# Patient Record
Sex: Female | Born: 2000
Health system: Southern US, Community
[De-identification: ages and names within clinical notes are randomized; demographics above are authoritative.]

## PROBLEM LIST (undated history)

## (undated) HISTORY — PX: TYMPANOSTOMY TUBE PLACEMENT: SHX32

---
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
## 2021-05-10 ENCOUNTER — Other Ambulatory Visit: Payer: Self-pay

## 2021-05-10 ENCOUNTER — Ambulatory Visit
Admission: RE | Admit: 2021-05-10 | Discharge: 2021-05-10 | Disposition: A | Discharge status: Home / Self Care | Payer: PRIVATE HEALTH INSURANCE | Source: Ambulatory Visit

## 2021-05-10 VITALS — BP 112/79 | HR 122 | Temp 99.5°F | Resp 16 | Wt 170.0 lb

## 2021-05-10 DIAGNOSIS — J069 Acute upper respiratory infection, unspecified: Secondary | ICD-10-CM | POA: Diagnosis not present

## 2021-05-10 MED ORDER — BENZONATATE 100 MG PO CAPS: 100.0000 mg | ORAL_CAPSULE | Freq: Three times a day (TID) | ORAL | 0 refills | Status: AC | PRN

## 2021-05-10 NOTE — Discharge Instructions (Addendum)
Your COVID and Influenza tests are pending.  You should self quarantine until the test results are back.    Take Tylenol or ibuprofen as needed for fever or discomfort.  Rest and keep yourself hydrated.    Follow-up with your primary care provider if your symptoms are not improving.     

## 2021-05-10 NOTE — ED Provider Notes (Signed)
[name]    CSN: [id number] Arrival date & time: 05/10/21  1050      History   Chief Complaint Chief Complaint  Patient presents with  . Cough  . Fever    HPI Monica Brown is a 20 y.o. female.   Patient presents with 3-day history of fever, nasal congestion, nonproductive cough.  T-max 102 last night.  Treatment at home with Tylenol cold medication.  She denies rash, sore throat, shortness of breath, vomiting, diarrhea, or other symptoms.  She denies pertinent medical history.  The history is provided by the patient.    History reviewed. No pertinent past medical history.  Patient Active Problem List   Diagnosis Date Noted  . Ear canal dryness, bilateral 04/27/2020  . Epigastric pain 04/27/2020  . CN (constipation) 12/02/2011    Past Surgical History:  Procedure Laterality Date  . TYMPANOSTOMY TUBE PLACEMENT Bilateral    child- and removed    OB History   No obstetric history on file.      Home Medications    Prior to Admission medications   Medication Sig Start Date End Date Taking? Authorizing Provider  benzonatate (TESSALON) 100 MG capsule Take 1 capsule (100 mg total) by mouth 3 (three) times daily as needed for cough. 05/10/21  Yes Mickie Bail, NP  levonorgestrel [facility]) 19.5 MG IUD by Intrauterine route once.   Yes [provider]  famotidine (PEPCID) 20 MG tablet Take 1 tablet (20 mg total) by mouth at bedtime. 04/26/20   Theadore Nan, NP  levonorgestrel (MIRENA) 20 MCG/24HR IUD 1 each by Intrauterine route once.    [provider]    Family History Family History  Problem Relation Age of Onset  . Healthy Mother   . Healthy Father     Social History Social History   Tobacco Use  . Smoking status: Never Smoker  . Smokeless tobacco: Never Used  Substance Use Topics  . Alcohol use: Never    Alcohol/week: 0.0 standard drinks  . Drug use: Never     Allergies   Patient has no known allergies.   Review  of Systems Review of Systems  Constitutional: Positive for fever. Negative for chills.  HENT: Positive for congestion. Negative for ear pain and sore throat.   Respiratory: Positive for cough. Negative for shortness of breath.   Cardiovascular: Negative for chest pain and palpitations.  Gastrointestinal: Negative for abdominal pain, diarrhea and vomiting.  Skin: Negative for color change and rash.  All other systems reviewed and are negative.    Physical Exam Triage Vital Signs ED Triage Vitals  Enc Vitals Group     BP      Pulse      Resp      Temp      Temp src      SpO2      Weight      Height      Head Circumference      Peak Flow      Pain Score      Pain Loc      Pain Edu?      Excl. in GC?    No data found.  Updated Vital Signs BP 112/79 (BP Location: Left Arm)   Pulse (!) 122   Temp 99.5 F (37.5 C) (Oral)   Resp 16   Wt 170 lb (77.1 kg)   SpO2 98%   BMI 29.18 kg/m   Visual Acuity Right Eye Distance:  Left Eye Distance:   Bilateral Distance:    Right Eye Near:   Left Eye Near:    Bilateral Near:     Physical Exam Vitals and nursing note reviewed.  Constitutional:      General: She is not in acute distress.    Appearance: She is well-developed.  HENT:     Head: Normocephalic and atraumatic.     Right Ear: Tympanic membrane normal.     Left Ear: Tympanic membrane normal.     Nose: Nose normal.     Mouth/Throat:     Mouth: Mucous membranes are moist.     Pharynx: Oropharynx is clear.  Eyes:     Conjunctiva/sclera: Conjunctivae normal.  Cardiovascular:     Rate and Rhythm: Normal rate and regular rhythm.     Heart sounds: Normal heart sounds.  Pulmonary:     Effort: Pulmonary effort is normal. No respiratory distress.     Breath sounds: Normal breath sounds.  Abdominal:     Palpations: Abdomen is soft.     Tenderness: There is no abdominal tenderness.  Musculoskeletal:     Cervical back: Neck supple.  Skin:    General: Skin is warm  and dry.  Neurological:     General: No focal deficit present.     Mental Status: She is alert and oriented to person, place, and time.     Gait: Gait normal.  Psychiatric:        Mood and Affect: Mood normal.        Behavior: Behavior normal.      UC Treatments / Results  Labs (all labs ordered are listed, but only abnormal results are displayed) Labs Reviewed  COVID-19, FLU A+B NAA    EKG   Radiology No results found.  Procedures Procedures (including critical care time)  Medications Ordered in UC Medications - No data to display  Initial Impression / Assessment and Plan / UC Course  I have reviewed the triage vital signs and the nursing notes.  Pertinent labs & imaging results that were available during my care of the patient were reviewed by me and considered in my medical decision making (see chart for details).   Viral URI with cough.  Influenza and COVID pending.  Instructed patient to self quarantine until the test results are back.  Discussed symptomatic treatment including Tessalon for cough, Tylenol or ibuprofen, rest, hydration.  Instructed patient to follow up with PCP if her symptoms are not improving.  Patient agrees to plan of care.    Final Clinical Impressions(s) / UC Diagnoses   Final diagnoses:  Viral URI with cough     Discharge Instructions     Your COVID and Influenza tests are pending.  You should self quarantine until the test results are back.    Take Tylenol or ibuprofen as needed for fever or discomfort.  Rest and keep yourself hydrated.    Follow-up with your primary care provider if your symptoms are not improving.        ED Prescriptions    Medication Sig Dispense Auth. Provider   benzonatate (TESSALON) 100 MG capsule Take 1 capsule (100 mg total) by mouth 3 (three) times daily as needed for cough. 21 capsule Mickie Bail, NP     PDMP not reviewed this encounter.   Mickie Bail, NP 05/10/21 1118

## 2021-05-10 NOTE — ED Triage Notes (Signed)
Patient presents to Urgent Care with complaints of cough, fever (99.7) x 3 days. Pt states she had 2 negative rapid tests. Treating symptoms with tylenol.   Denies abdominal pain, or n/v.

## 2021-05-11 LAB — COVID-19, FLU A+B NAA
Influenza A, NAA: DETECTED — AB
Influenza B, NAA: NOT DETECTED
SARS-CoV-2, NAA: NOT DETECTED

## 2021-06-12 ENCOUNTER — Ambulatory Visit
Admission: EM | Admit: 2021-06-12 | Discharge: 2021-06-12 | Disposition: A | Discharge status: Home / Self Care | Payer: PRIVATE HEALTH INSURANCE | Attending: Emergency Medicine | Admitting: Emergency Medicine

## 2021-06-12 ENCOUNTER — Encounter: Payer: Self-pay | Admitting: Emergency Medicine

## 2021-06-12 ENCOUNTER — Other Ambulatory Visit: Payer: Self-pay

## 2021-06-12 DIAGNOSIS — J029 Acute pharyngitis, unspecified: Secondary | ICD-10-CM | POA: Insufficient documentation

## 2021-06-12 DIAGNOSIS — J358 Other chronic diseases of tonsils and adenoids: Secondary | ICD-10-CM

## 2021-06-12 LAB — POCT RAPID STREP A (OFFICE): Rapid Strep A Screen: NEGATIVE

## 2021-06-12 NOTE — Discharge Instructions (Addendum)
Your rapid strep test is negative.  A throat culture is pending; we will call you if it is positive requiring treatment.    Take Tylenol or ibuprofen as needed for discomfort.  Follow up with your primary care provider if your symptoms are not improving.    

## 2021-06-12 NOTE — ED Provider Notes (Signed)
[name]    CSN: [id number] Arrival date & time: 06/12/21  1412      History   Chief Complaint Chief Complaint  Patient presents with   Sore Throat    HPI Monica Brown is a 20 y.o. female.  Patient presents with sore throat since this morning.  She denies fever, chills, rash, cough, shortness of breath, or other symptoms.  No treatments attempted at home.  Patient states she has history of tonsil stones.  She also states she has recently been treated with MetroGel and is concerned that she may have developed a yeast infection in her mouth from the office.    The history is provided by the patient.   History reviewed. No pertinent past medical history.  Patient Active Problem List   Diagnosis Date Noted   Ear canal dryness, bilateral 04/27/2020   Epigastric pain 04/27/2020   CN (constipation) 12/02/2011    Past Surgical History:  Procedure Laterality Date   TYMPANOSTOMY TUBE PLACEMENT Bilateral    child- and removed    OB History   No obstetric history on file.      Home Medications    Prior to Admission medications   Medication Sig Start Date End Date Taking? Authorizing Provider  benzonatate (TESSALON) 100 MG capsule Take 1 capsule (100 mg total) by mouth 3 (three) times daily as needed for cough. 05/10/21   Mickie Bail, NP  famotidine (PEPCID) 20 MG tablet Take 1 tablet (20 mg total) by mouth at bedtime. 04/26/20   Theadore Nan, NP  levonorgestrel (KYLEENA) 19.5 MG IUD by Intrauterine route once.    [provider]  levonorgestrel (MIRENA) 20 MCG/24HR IUD 1 each by Intrauterine route once.    [provider]    Family History Family History  Problem Relation Age of Onset   Healthy Mother    Healthy Father     Social History Social History   Tobacco Use   Smoking status: Never   Smokeless tobacco: Never  Substance Use Topics   Alcohol use: Never    Alcohol/week: 0.0 standard drinks   Drug use: Never      Allergies   Patient has no known allergies.   Review of Systems Review of Systems  Constitutional:  Negative for chills and fever.  HENT:  Positive for sore throat. Negative for ear pain.   Respiratory:  Negative for cough and shortness of breath.   Cardiovascular:  Negative for chest pain and palpitations.  Gastrointestinal:  Negative for abdominal pain and vomiting.  Skin:  Negative for color change and rash.  All other systems reviewed and are negative.   Physical Exam Triage Vital Signs ED Triage Vitals  Enc Vitals Group     BP      Pulse      Resp      Temp      Temp src      SpO2      Weight      Height      Head Circumference      Peak Flow      Pain Score      Pain Loc      Pain Edu?      Excl. in GC?    No data found.  Updated Vital Signs BP 104/72 (BP Location: Left Arm)   Pulse 98   Temp 98.8 F (37.1 C) (Oral)   Resp 18   LMP  (Approximate)   SpO2  97%   Visual Acuity Right Eye Distance:   Left Eye Distance:   Bilateral Distance:    Right Eye Near:   Left Eye Near:    Bilateral Near:     Physical Exam Vitals and nursing note reviewed.  Constitutional:      General: She is not in acute distress.    Appearance: She is well-developed. She is not ill-appearing.  HENT:     Head: Normocephalic and atraumatic.     Right Ear: Tympanic membrane normal.     Left Ear: Tympanic membrane normal.     Nose: Nose normal.     Mouth/Throat:     Mouth: Mucous membranes are moist.     Pharynx: Oropharynx is clear.     Comments: Three small tonsil stones noted.  No exudate.  Eyes:     Conjunctiva/sclera: Conjunctivae normal.  Cardiovascular:     Rate and Rhythm: Normal rate and regular rhythm.     Heart sounds: Normal heart sounds.  Pulmonary:     Effort: Pulmonary effort is normal. No respiratory distress.     Breath sounds: Normal breath sounds.  Abdominal:     Palpations: Abdomen is soft.     Tenderness: There is no abdominal tenderness.   Musculoskeletal:     Cervical back: Neck supple.  Skin:    General: Skin is warm and dry.  Neurological:     General: No focal deficit present.     Mental Status: She is alert and oriented to person, place, and time.     Gait: Gait normal.  Psychiatric:        Mood and Affect: Mood normal.        Behavior: Behavior normal.     UC Treatments / Results  Labs (all labs ordered are listed, but only abnormal results are displayed) Labs Reviewed  CULTURE, GROUP A STREP [facility])  POCT RAPID STREP A (OFFICE)    EKG   Radiology No results found.  Procedures Procedures (including critical care time)  Medications Ordered in UC Medications - No data to display  Initial Impression / Assessment and Plan / UC Course  I have reviewed the triage vital signs and the nursing notes.  Pertinent labs & imaging results that were available during my care of the patient were reviewed by me and considered in my medical decision making (see chart for details).   Sore throat, tonsilloliths.  Rapid strep negative; culture pending.  Instructed patient to take Tylenol or ibuprofen as needed for discomfort.  Discussed to follow-up with her PCP or an ENT if her symptoms persist.  Education provided on sore throats.  Patient agrees to plan of care.     Final Clinical Impressions(s) / UC Diagnoses   Final diagnoses:  Sore throat  Tonsillolith     Discharge Instructions      Your rapid strep test is negative.  A throat culture is pending; we will call you if it is positive requiring treatment.    Take Tylenol or ibuprofen as needed for discomfort.    Follow-up with your primary care provider if your symptoms are not improving.         ED Prescriptions   None    PDMP not reviewed this encounter.   Mickie Bail, NP 06/12/21 [phone]

## 2021-06-12 NOTE — ED Triage Notes (Signed)
Patient c/o sore throat that started today.   Patient denies fever, cough, ear pain, and nasal congestion.   Patient endorses recently finishing metrogel which was given by OBGYN doctor upon onset of symptoms.    Patient endorses " a little pain with swallowing".   Patient endorses having tonsillar "stones".   Patient denies any medication use for symptoms.

## 2021-06-15 LAB — CULTURE, GROUP A STREP (THRC)

## 2022-08-08 ENCOUNTER — Ambulatory Visit
Admission: EM | Admit: 2022-08-08 | Discharge: 2022-08-08 | Disposition: A | Discharge status: Home / Self Care | Payer: PRIVATE HEALTH INSURANCE | Attending: Family Medicine | Admitting: Family Medicine

## 2022-08-08 ENCOUNTER — Encounter: Payer: Self-pay | Admitting: Emergency Medicine

## 2022-08-08 DIAGNOSIS — Z20822 Contact with and (suspected) exposure to covid-19: Secondary | ICD-10-CM | POA: Diagnosis not present

## 2022-08-08 DIAGNOSIS — J069 Acute upper respiratory infection, unspecified: Secondary | ICD-10-CM | POA: Diagnosis not present

## 2022-08-08 DIAGNOSIS — L259 Unspecified contact dermatitis, unspecified cause: Secondary | ICD-10-CM | POA: Insufficient documentation

## 2022-08-08 LAB — RESP PANEL BY RT-PCR (FLU A&B, COVID) ARPGX2
Influenza A by PCR: NEGATIVE
Influenza B by PCR: NEGATIVE
SARS Coronavirus 2 by RT PCR: NEGATIVE

## 2022-08-08 LAB — POCT RAPID STREP A (OFFICE): Rapid Strep A Screen: NEGATIVE

## 2022-08-08 MED ORDER — TRIAMCINOLONE ACETONIDE 0.025 % EX CREA: 1.0000 | TOPICAL_CREAM | Freq: Three times a day (TID) | CUTANEOUS | 0 refills | Status: AC | PRN

## 2022-08-08 NOTE — Discharge Instructions (Signed)
Recommend for nasal congestion Sudafed (over prescription) 60 mg every 6 hours. Tylenol and Ibuprofen as needed for fever or bodyaches. Your COVID/FLU results will be available in 12-16 hours. Negative results are immediately resulted to Mychart. Positive results will receive a follow-up call from our clinic. If symptoms are present, I recommend home quarantine until results are known.

## 2022-08-08 NOTE — ED Provider Notes (Signed)
[name]    CSN: [id number] Arrival date & time: 08/08/22  0857      History   Chief Complaint Chief Complaint  Patient presents with   Sore Throat    sore throat, congestion and fever - Entered by patient    HPI Monica Brown is a 21 y.o. female.   HPI Patient presents with URI symptoms including cough, sore throat,  nasal congestion x 2-3 days. Took home COVID test at the onset of symptoms which was negative. Reports low grade temp upper 99's. She has had body aches. Denies any known sick contacts. She has taken OTC Cold and Sinus medication without relief of symptoms.  History reviewed. No pertinent past medical history.  Patient Active Problem List   Diagnosis Date Noted   Ear canal dryness, bilateral 04/27/2020   Epigastric pain 04/27/2020   CN (constipation) 12/02/2011    Past Surgical History:  Procedure Laterality Date   TYMPANOSTOMY TUBE PLACEMENT Bilateral    child- and removed    OB History   No obstetric history on file.      Home Medications    Prior to Admission medications   Medication Sig Start Date End Date Taking? Authorizing Provider  triamcinolone (KENALOG) 0.025 % cream Apply 1 Application topically 3 (three) times daily as needed (apply to areas of irritation). 08/08/22  Yes Bing Neighbors, FNP  benzonatate (TESSALON) 100 MG capsule Take 1 capsule (100 mg total) by mouth 3 (three) times daily as needed for cough. 05/10/21   Mickie Bail, NP  famotidine (PEPCID) 20 MG tablet Take 1 tablet (20 mg total) by mouth at bedtime. 04/26/20   Theadore Nan, NP  levonorgestrel (KYLEENA) 19.5 MG IUD by Intrauterine route once.    [provider]  levonorgestrel (MIRENA) 20 MCG/24HR IUD 1 each by Intrauterine route once.    [provider]    Family History Family History  Problem Relation Age of Onset   Healthy Mother    Healthy Father     Social History Social History   Tobacco Use   Smoking status: Never    Smokeless tobacco: Never  Substance Use Topics   Alcohol use: Never    Alcohol/week: 0.0 standard drinks of alcohol   Drug use: Never     Allergies   Patient has no known allergies.   Review of Systems Review of Systems Pertinent negatives listed in HPI  Physical Exam Triage Vital Signs ED Triage Vitals  Enc Vitals Group     BP 08/08/22 0904 114/79     Pulse Rate 08/08/22 0904 (!) 103     Resp 08/08/22 0904 16     Temp 08/08/22 0904 98.4 F (36.9 C)     Temp Source 08/08/22 0904 Oral     SpO2 08/08/22 0904 96 %     Weight --      Height --      Head Circumference --      Peak Flow --      Pain Score 08/08/22 0906 0     Pain Loc --      Pain Edu? --      Excl. in GC? --    No data found.  Updated Vital Signs BP 114/79 (BP Location: Left Arm)   Pulse (!) 103   Temp 98.4 F (36.9 C) (Oral)   Resp 16   SpO2 96%   Visual Acuity Right Eye Distance:   Left Eye Distance:  Bilateral Distance:    Right Eye Near:   Left Eye Near:    Bilateral Near:     Physical Exam  General Appearance:    Alert, cooperative, no distress  HENT:   Normocephalic, ears normal, nares mucosal edema with congestion, rhinorrhea, oropharynx  erythema without exudate   Eyes:    PERRL, conjunctiva/corneas clear, EOM's intact       Lungs:     Clear to auscultation bilaterally, respirations unlabored  Heart:    Regular rate and rhythm  Neurologic:   Awake, alert, oriented x 3. No apparent focal neurological           defect.     UC Treatments / Results  Labs (all labs ordered are listed, but only abnormal results are displayed) Labs Reviewed  RESP PANEL BY RT-PCR (FLU A&B, COVID) [id number]  POCT RAPID STREP A (OFFICE)    EKG   Radiology No results found.  Procedures Procedures (including critical care time)  Medications Ordered in UC Medications - No data to display  Initial Impression / Assessment and Plan / UC Course  I have reviewed the triage vital signs and the  nursing notes.  Pertinent labs & imaging results that were available during my care of the patient were reviewed by me and considered in my medical decision making (see chart for details).    COVID/Flu test pending. Symptom management warranted only.  Manage fever with Tylenol and ibuprofen.  Nasal symptoms with over-the-counter antihistamines recommended.  Treatment per discharge medications/discharge instructions.  Red flags/ER precautions given. The most current CDC isolation/quarantine recommendation advised.   Final Clinical Impressions(s) / UC Diagnoses   Final diagnoses:  Encounter for laboratory testing for COVID-19 virus  Viral URI  Contact dermatitis, unspecified contact dermatitis type, unspecified trigger     Discharge Instructions      Recommend for nasal congestion Sudafed (over prescription) 60 mg every 6 hours. Tylenol and Ibuprofen as needed for fever or bodyaches. Your COVID/FLU results will be available in 12-16 hours. Negative results are immediately resulted to Mychart. Positive results will receive a follow-up call from our clinic. If symptoms are present, I recommend home quarantine until results are known.      ED Prescriptions     Medication Sig Dispense Auth. Provider   triamcinolone (KENALOG) 0.025 % cream Apply 1 Application topically 3 (three) times daily as needed (apply to areas of irritation). 454 g Bing Neighbors, FNP      PDMP not reviewed this encounter.   Bing Neighbors, FNP 08/08/22 1104

## 2022-08-08 NOTE — ED Triage Notes (Addendum)
Nasal congestion, sore throat, cough with mucus production, ear pain, headache, generalized body aches x 2-3 days. Tmax 99.6 at home with chills. Has not been exposed to sick persons. Taking tylenol cold and sinus at home with minimal improvement. Denies abdominal pain, N/V/D, SOB, chest pain, palpitations, wheezing. State the cough is worse at night and keeping her from sleeping.
